# Patient Record
Sex: Female | Born: 1950 | Race: Black or African American | Hispanic: No | Marital: Single | Smoking: Never smoker
Health system: Southern US, Community
[De-identification: ages and names within clinical notes are randomized; demographics above are authoritative.]

## PROBLEM LIST (undated history)

## (undated) DIAGNOSIS — I10 Essential (primary) hypertension: Secondary | ICD-10-CM

## (undated) DIAGNOSIS — E7800 Pure hypercholesterolemia, unspecified: Secondary | ICD-10-CM

---

## 2001-08-07 ENCOUNTER — Emergency Department (HOSPITAL_COMMUNITY): Admission: EM | Admit: 2001-08-07 | Discharge: 2001-08-08 | Payer: Self-pay | Admitting: Emergency Medicine

## 2001-08-07 ENCOUNTER — Encounter: Payer: Self-pay | Admitting: Emergency Medicine

## 2002-03-05 ENCOUNTER — Other Ambulatory Visit: Admission: RE | Admit: 2002-03-05 | Discharge: 2002-03-05 | Payer: Self-pay | Admitting: *Deleted

## 2003-02-05 ENCOUNTER — Encounter: Payer: Self-pay | Admitting: Emergency Medicine

## 2003-02-05 ENCOUNTER — Emergency Department (HOSPITAL_COMMUNITY): Admission: EM | Admit: 2003-02-05 | Discharge: 2003-02-05 | Payer: Self-pay | Admitting: Emergency Medicine

## 2003-09-23 ENCOUNTER — Other Ambulatory Visit: Admission: RE | Admit: 2003-09-23 | Discharge: 2003-09-23 | Payer: Self-pay | Admitting: Obstetrics and Gynecology

## 2004-08-05 ENCOUNTER — Emergency Department (HOSPITAL_COMMUNITY): Admission: EM | Admit: 2004-08-05 | Discharge: 2004-08-05 | Payer: Self-pay | Admitting: Emergency Medicine

## 2004-10-27 ENCOUNTER — Other Ambulatory Visit: Admission: RE | Admit: 2004-10-27 | Discharge: 2004-10-27 | Payer: Self-pay | Admitting: Obstetrics and Gynecology

## 2006-01-11 ENCOUNTER — Emergency Department (HOSPITAL_COMMUNITY): Admission: EM | Admit: 2006-01-11 | Discharge: 2006-01-11 | Payer: Self-pay | Admitting: Emergency Medicine

## 2010-07-05 ENCOUNTER — Other Ambulatory Visit: Payer: Self-pay | Admitting: Family Medicine

## 2010-07-05 DIAGNOSIS — Z1231 Encounter for screening mammogram for malignant neoplasm of breast: Secondary | ICD-10-CM

## 2010-07-25 ENCOUNTER — Ambulatory Visit: Payer: Self-pay

## 2011-02-04 ENCOUNTER — Inpatient Hospital Stay (INDEPENDENT_AMBULATORY_CARE_PROVIDER_SITE_OTHER)
Admission: RE | Admit: 2011-02-04 | Discharge: 2011-02-04 | Disposition: A | Discharge status: Home / Self Care | Payer: Self-pay | Source: Ambulatory Visit | Attending: Emergency Medicine | Admitting: Emergency Medicine

## 2011-02-04 DIAGNOSIS — H109 Unspecified conjunctivitis: Secondary | ICD-10-CM

## 2012-01-08 ENCOUNTER — Emergency Department (HOSPITAL_COMMUNITY)
Admission: EM | Admit: 2012-01-08 | Discharge: 2012-01-08 | Disposition: A | Discharge status: Home / Self Care | Payer: PRIVATE HEALTH INSURANCE | Attending: Emergency Medicine | Admitting: Emergency Medicine

## 2012-01-08 ENCOUNTER — Emergency Department (HOSPITAL_COMMUNITY): Payer: PRIVATE HEALTH INSURANCE

## 2012-01-08 ENCOUNTER — Encounter (HOSPITAL_COMMUNITY): Payer: Self-pay

## 2012-01-08 DIAGNOSIS — E7800 Pure hypercholesterolemia, unspecified: Secondary | ICD-10-CM | POA: Insufficient documentation

## 2012-01-08 DIAGNOSIS — M79643 Pain in unspecified hand: Secondary | ICD-10-CM

## 2012-01-08 DIAGNOSIS — M79609 Pain in unspecified limb: Secondary | ICD-10-CM | POA: Insufficient documentation

## 2012-01-08 DIAGNOSIS — I10 Essential (primary) hypertension: Secondary | ICD-10-CM | POA: Insufficient documentation

## 2012-01-08 HISTORY — DX: Pure hypercholesterolemia, unspecified: E78.00

## 2012-01-08 HISTORY — DX: Essential (primary) hypertension: I10

## 2012-01-08 MED ORDER — HYDROCODONE-ACETAMINOPHEN 5-325 MG PO TABS
1.0000 | ORAL_TABLET | Freq: Once | ORAL | Status: AC
  Administered 2012-01-08: 1 via ORAL
  Filled 2012-01-08: qty 1

## 2012-01-08 MED ORDER — IBUPROFEN 600 MG PO TABS: 600.0000 mg | ORAL_TABLET | Freq: Three times a day (TID) | ORAL | Status: AC | PRN

## 2012-01-08 MED ORDER — HYDROCODONE-ACETAMINOPHEN 5-500 MG PO TABS: 1.0000 | ORAL_TABLET | Freq: Four times a day (QID) | ORAL | Status: AC | PRN

## 2012-01-08 MED ORDER — IBUPROFEN 400 MG PO TABS
400.0000 mg | ORAL_TABLET | Freq: Once | ORAL | Status: AC
  Administered 2012-01-08: 400 mg via ORAL
  Filled 2012-01-08: qty 1

## 2012-01-08 NOTE — Progress Notes (Signed)
Orthopedic Tech Progress Note Patient Details:  Renee George Feb 21, 1951 [id number]  Ortho Devices Type of Ortho Device: Velcro wrist splint Ortho Device/Splint Location: right wrist Ortho Device/Splint Interventions: Application   Nikki Dom 01/08/2012, 3:38 PM

## 2012-01-08 NOTE — ED Notes (Signed)
First injured her right hand years ago today has noted swelling to hand and sts may have splinter in hand. No new injury

## 2012-01-08 NOTE — ED Provider Notes (Signed)
History  This chart was scribed for Suzi Roots, MD by Bennett Scrape. This patient was seen in room TR08C/TR08C and the patient's care was started at 2:00PM.  CSN: [id number]  Arrival date & time 01/08/12  1212   None     Chief Complaint  Patient presents with  . Hand Pain     Patient is a 61 y.o. female presenting with hand pain. The history is provided by the patient. No language interpreter was used.  Hand Pain This is a new problem. The current episode started 2 days ago. The problem occurs constantly. The problem has been gradually worsening. Nothing relieves the symptoms.    Renee George is a 61 y.o. female who presents to the Emergency Department complaining of 2 days of gradual onset, gradually worsening, constant right hand pain with associated swelling. The pain is worse with use of the hand and better with rest. She denies taking OTC medications at home to improve symptoms. She reports one prior injury to the same hand 20 years ago but denies an recent injuries or having prior episodes of similar symptoms. She denies fever, nausea, emesis, HA and rash as associated symptoms.She has a h/o HTN. She denies smoking and alcohol use.  No hx gout. Since initial injury 20 yrs ago, no recurrent problems w hand pain or swelling. No recent trauma. No skin changes or redness, no fever or chills.    Past Medical History  Diagnosis Date  . Hypertension   . High cholesterol     No past surgical history on file.  No family history on file.  History  Substance Use Topics  . Smoking status: Never Smoker   . Smokeless tobacco: Not on file  . Alcohol Use: No    No OB history provided.  Review of Systems  Constitutional: Negative for fever and chills.  Gastrointestinal: Negative for nausea and vomiting.  Musculoskeletal: Negative for back pain.       Positive for right hand pain  Skin: Negative for rash.    Allergies  Review of patient's allergies indicates no  known allergies.  Home Medications   Current Outpatient Rx  Name Route Sig Dispense Refill  . AMLODIPINE BESYLATE 10 MG PO TABS Oral Take 10 mg by mouth daily.    . OMEGA-3 FATTY ACIDS 1000 MG PO CAPS Oral Take 2 g by mouth daily.    . IBUPROFEN 200 MG PO TABS Oral Take 600 mg by mouth every 6 (six) hours as needed. For pain    . LOVASTATIN 20 MG PO TABS Oral Take 20 mg by mouth at bedtime.      Triage vitals: BP 154/74  Pulse 80  Temp 98.4 F (36.9 C)  Resp 18  SpO2 99%  Physical Exam  Nursing note and vitals reviewed. Constitutional: She is oriented to person, place, and time. She appears well-developed and well-nourished. No distress.  HENT:  Head: Normocephalic and atraumatic.  Eyes: EOM are normal.  Neck: Neck supple. No tracheal deviation present.  Cardiovascular: Normal rate.   Pulmonary/Chest: Effort normal. No respiratory distress.  Musculoskeletal: Normal range of motion.       Mild tenderness and edema to the right hand over the 4th and 5th metatarsals and mtp region. Good rom digits without pain. No skin changes or erythema. Normal cap refill distally. Skin intact. No epitroch or axill l/a. No lymphangitis.   Neurological: She is alert and oriented to person, place, and time.  Skin: Skin is warm  and dry.  Psychiatric: She has a normal mood and affect. Her behavior is normal.    ED Course  Procedures (including critical care time)  DIAGNOSTIC STUDIES: Oxygen Saturation is 99% on room air, normal by my interpretation.    COORDINATION OF CARE: 2:31PM-Discussed treatment plan with pt at bedside and pt agreed to plan.   Labs Reviewed - No data to display Dg Hand 2 View Right  01/08/2012  *RADIOLOGY REPORT*  Clinical Data: Fifth metacarpal pain  RIGHT HAND - 2 VIEW  Comparison: None.  Findings: Two views of the right hand submitted.  Small calcifications are noted within the soft tissue adjacent to the distal aspect of fifth metacarpal.  This may be due to prior  injury or dystrophic calcifications.  No acute fracture or subluxation.  IMPRESSION: Small calcifications are noted within the soft tissue adjacent to the distal aspect of fifth metacarpal.  This may be due to prior injury or dystrophic calcifications.  No acute fracture or subluxation.  Original Report Authenticated By: Natasha Mead, M.D.       MDM  I personally performed the services described in this documentation, which was scribed in my presence. The recorded information has been reviewed and considered. Suzi Roots, MD   No redness or increased warmth to hand. To breaks in skin or wounds to hand.  Mild sts and marked tenderness over 5th mt, mtp region. Splint. Motrin, vicodin.  Discussed diff dx and xrays w pt. Will refer to hand f/u.    Suzi Roots, MD 01/08/12 1535

## 2012-10-18 ENCOUNTER — Encounter (HOSPITAL_COMMUNITY): Payer: Self-pay | Admitting: Cardiology

## 2012-10-18 ENCOUNTER — Emergency Department (HOSPITAL_COMMUNITY): Payer: 59

## 2012-10-18 ENCOUNTER — Emergency Department (HOSPITAL_COMMUNITY)
Admission: EM | Admit: 2012-10-18 | Discharge: 2012-10-18 | Disposition: A | Discharge status: Home / Self Care | Payer: 59 | Attending: Emergency Medicine | Admitting: Emergency Medicine

## 2012-10-18 DIAGNOSIS — E7800 Pure hypercholesterolemia, unspecified: Secondary | ICD-10-CM | POA: Insufficient documentation

## 2012-10-18 DIAGNOSIS — Z79899 Other long term (current) drug therapy: Secondary | ICD-10-CM | POA: Insufficient documentation

## 2012-10-18 DIAGNOSIS — R51 Headache: Secondary | ICD-10-CM | POA: Insufficient documentation

## 2012-10-18 DIAGNOSIS — I10 Essential (primary) hypertension: Secondary | ICD-10-CM | POA: Insufficient documentation

## 2012-10-18 DIAGNOSIS — Z91040 Latex allergy status: Secondary | ICD-10-CM | POA: Insufficient documentation

## 2012-10-18 DIAGNOSIS — J189 Pneumonia, unspecified organism: Secondary | ICD-10-CM

## 2012-10-18 DIAGNOSIS — M6281 Muscle weakness (generalized): Secondary | ICD-10-CM | POA: Insufficient documentation

## 2012-10-18 DIAGNOSIS — R42 Dizziness and giddiness: Secondary | ICD-10-CM | POA: Insufficient documentation

## 2012-10-18 LAB — COMPREHENSIVE METABOLIC PANEL
ALT: 14 U/L (ref 0–35)
Alkaline Phosphatase: 72 U/L (ref 39–117)
CO2: 23 mEq/L (ref 19–32)
Chloride: 106 mEq/L (ref 96–112)
GFR calc Af Amer: 83 mL/min — ABNORMAL LOW (ref >90)
GFR calc non Af Amer: 72 mL/min — ABNORMAL LOW (ref >90)
Glucose, Bld: 138 mg/dL — ABNORMAL HIGH (ref 70–99)
Potassium: 3.5 mEq/L (ref 3.5–5.1)
Sodium: 141 mEq/L (ref 135–145)
Total Bilirubin: 0.2 mg/dL — ABNORMAL LOW (ref 0.3–1.2)

## 2012-10-18 LAB — CBC WITH DIFFERENTIAL/PLATELET
Hemoglobin: 11.1 g/dL — ABNORMAL LOW (ref 12.0–15.0)
Lymphocytes Relative: 52 % — ABNORMAL HIGH (ref 12–46)
Lymphs Abs: 2.8 10*3/uL (ref 0.7–4.0)
MCV: 72.7 fL — ABNORMAL LOW (ref 78.0–100.0)
Neutrophils Relative %: 38 % — ABNORMAL LOW (ref 43–77)
Platelets: 132 10*3/uL — ABNORMAL LOW (ref 150–400)
RBC: 4.62 MIL/uL (ref 3.87–5.11)
WBC: 5.3 10*3/uL (ref 4.0–10.5)

## 2012-10-18 LAB — MAGNESIUM: Magnesium: 2 mg/dL (ref 1.5–2.5)

## 2012-10-18 LAB — URINALYSIS, ROUTINE W REFLEX MICROSCOPIC
Bilirubin Urine: NEGATIVE
Hgb urine dipstick: NEGATIVE
Protein, ur: NEGATIVE mg/dL
Specific Gravity, Urine: 1.013 (ref 1.005–1.030)
Urobilinogen, UA: 0.2 mg/dL (ref 0.0–1.0)

## 2012-10-18 MED ORDER — LEVOFLOXACIN 750 MG PO TABS: 750.0000 mg | ORAL_TABLET | Freq: Every day | ORAL | Status: AC

## 2012-10-18 MED ORDER — LEVOFLOXACIN 750 MG PO TABS
750.0000 mg | ORAL_TABLET | Freq: Once | ORAL | Status: AC
  Administered 2012-10-18: 750 mg via ORAL
  Filled 2012-10-18: qty 1

## 2012-10-18 MED ORDER — SODIUM CHLORIDE 0.9 % IV BOLUS (SEPSIS)
1000.0000 mL | Freq: Once | INTRAVENOUS | Status: AC
  Administered 2012-10-18: 1000 mL via INTRAVENOUS

## 2012-10-18 NOTE — ED Notes (Signed)
Waiting on phlebotomy to draw blood before discharging pt.

## 2012-10-18 NOTE — ED Notes (Signed)
Pt transported to and from CT scanner on stretcher with tech, tolerated well. 

## 2012-10-18 NOTE — ED Notes (Signed)
Pt to department via EMS from work- reports last night she started feeling weak at home and had a headache. Reports this morning the headache got worse and general weakness. BP-188/110, then 162/110. Hr-70 20g in place. 12 lead unremarkable.

## 2012-10-18 NOTE — ED Provider Notes (Signed)
History     CSN: [id number]  Arrival date & time 10/18/12  0827   First MD Initiated Contact with Patient 10/18/12 [phone]      Chief Complaint  Patient presents with  . Weakness    (Consider location/radiation/quality/duration/timing/severity/associated sxs/prior treatment) HPI  Patient presents with concerns generalized weakness. Symptoms began last night, with headache, generalized weakness. Since onset headache is become worse, socially.  Initially there was progression of generalized weakness, but this has improved in her lower extremities, she currently complains of weakness in her upper extremities No asymmetry, no seizure, no spasm. No chest pain, no vomiting, no dyspnea. No fever, no cough, no chills. No dysuria. The patient states that she took 2 doses of her Norvasc yesterday, erroneously.    Past Medical History  Diagnosis Date  . Hypertension   . High cholesterol     History reviewed. No pertinent past surgical history.  History reviewed. No pertinent family history.  History  Substance Use Topics  . Smoking status: Never Smoker   . Smokeless tobacco: Not on file  . Alcohol Use: No    OB History   Grav Para Term Preterm Abortions TAB SAB Ect Mult Living                  Review of Systems  Constitutional:       Per HPI, otherwise negative  HENT:       Per HPI, otherwise negative  Respiratory:       Per HPI, otherwise negative  Cardiovascular:       Per HPI, otherwise negative  Gastrointestinal: Negative for vomiting.  Endocrine:       Negative aside from HPI  Genitourinary:       Neg aside from HPI   Musculoskeletal:       Per HPI, otherwise negative  Skin: Negative.   Neurological: Positive for dizziness, weakness and headaches. Negative for tremors, seizures, syncope, facial asymmetry, speech difficulty, light-headedness and numbness.    Allergies  Latex  Home Medications   Current Outpatient Rx  Name  Route  Sig  Dispense   Refill  . amLODipine (NORVASC) 10 MG tablet   Oral   Take 10 mg by mouth daily.         Marland Kitchen ibuprofen (ADVIL,MOTRIN) 200 MG tablet   Oral   Take 200 mg by mouth every 8 (eight) hours as needed for pain. For pain         . lovastatin (MEVACOR) 20 MG tablet   Oral   Take 20 mg by mouth at bedtime.           BP 178/85  Pulse 73  Temp(Src) 98 F (36.7 C) (Oral)  Resp 12  SpO2 97%  Physical Exam  Nursing note and vitals reviewed. Constitutional: She is oriented to person, place, and time. She appears well-developed and well-nourished. No distress.  HENT:  Head: Normocephalic and atraumatic.  Eyes: Conjunctivae and EOM are normal.  Cardiovascular: Normal rate and regular rhythm.   Pulmonary/Chest: Effort normal and breath sounds normal. No stridor. No respiratory distress.  Abdominal: She exhibits no distension.  Musculoskeletal: She exhibits no edema.  Neurological: She is alert and oriented to person, place, and time. No cranial nerve deficit. She exhibits abnormal muscle tone. Coordination normal.  In the lower extremities, strength is 5/5 in hips, knees, ankles. In the upper extremities, strength is 4/5, though improves with encouragement in the shoulders, wrists, elbows. This is symmetric.  Sensation is appropriate  throughout.  Skin: Skin is warm and dry.  Psychiatric: She has a normal mood and affect.    ED Course  Procedures (including critical care time)  Labs Reviewed  CBC WITH DIFFERENTIAL  COMPREHENSIVE METABOLIC PANEL  TROPONIN I  URINALYSIS, ROUTINE W REFLEX MICROSCOPIC  CALCIUM, IONIZED  MAGNESIUM   No results found.   No diagnosis found.  Pulse ox 99% room air normal  11:38 AM Patient ambulatory, though she continues to c/o weakness.  With abnormal CXR, CT ordered.   Date: 10/18/2012  Rate: 69  Rhythm: normal sinus rhythm  QRS Axis: normal  Intervals: normal  ST/T Wave abnormalities: normal  Conduction Disutrbances:none  Narrative  Interpretation:   Old EKG Reviewed: none available Unremarkable    CT results demonstrate a likely pneumonitis, with some concern for autoimmune versus infectious pathology. I discussed the CT with our pulmonology team.  We agreed to initiate antibiotics, and absent fever, chills, leukocytosis, the patient had initiation of autoimmune workup.  I then made a followup appointment for the patient in 4 days at pulmonology clinic. MDM  Patient presents with generalized weakness, no focal abnormalities beyond slightly diminished upper extremity weakness.  Given that she is otherwise neurovascularly intact, has no significant comorbidities, no history of autoimmune disease, has no fever, no chills, no leukocytosis, and largely reassuring laboratory and vital signs, we initiated antibiotics for what seems to be pneumonitis completed by infection.  I arranged close outpatient followup for the patient, she was discharged in stable condition with return precautions, follow instructions.        Gerhard Munch, MD 10/18/12 [phone]

## 2012-10-18 NOTE — ED Notes (Signed)
Family at bedside. 

## 2012-10-19 LAB — RHEUMATOID FACTOR: Rhuematoid fact SerPl-aCnc: <10 IU/mL (ref <=14)

## 2012-10-22 ENCOUNTER — Other Ambulatory Visit (INDEPENDENT_AMBULATORY_CARE_PROVIDER_SITE_OTHER): Payer: PRIVATE HEALTH INSURANCE

## 2012-10-22 ENCOUNTER — Ambulatory Visit (INDEPENDENT_AMBULATORY_CARE_PROVIDER_SITE_OTHER): Payer: PRIVATE HEALTH INSURANCE | Admitting: Internal Medicine

## 2012-10-22 ENCOUNTER — Encounter: Payer: Self-pay | Admitting: Internal Medicine

## 2012-10-22 ENCOUNTER — Encounter: Payer: Self-pay | Admitting: Allergy

## 2012-10-22 VITALS — BP 120/72 | HR 96 | Ht 61.0 in | Wt 169.6 lb

## 2012-10-22 DIAGNOSIS — J8410 Pulmonary fibrosis, unspecified: Secondary | ICD-10-CM

## 2012-10-22 DIAGNOSIS — M329 Systemic lupus erythematosus, unspecified: Secondary | ICD-10-CM

## 2012-10-22 DIAGNOSIS — R05 Cough: Secondary | ICD-10-CM

## 2012-10-22 DIAGNOSIS — R0602 Shortness of breath: Secondary | ICD-10-CM

## 2012-10-22 DIAGNOSIS — R768 Other specified abnormal immunological findings in serum: Secondary | ICD-10-CM | POA: Insufficient documentation

## 2012-10-22 DIAGNOSIS — R053 Chronic cough: Secondary | ICD-10-CM | POA: Insufficient documentation

## 2012-10-22 DIAGNOSIS — J849 Interstitial pulmonary disease, unspecified: Secondary | ICD-10-CM | POA: Insufficient documentation

## 2012-10-22 LAB — CK: Total CK: 183 U/L — ABNORMAL HIGH (ref 7–177)

## 2012-10-22 LAB — ANTI-SCLERODERMA ANTIBODY: Scleroderma (Scl-70) (ENA) Antibody, IgG: 1 AU/mL (ref <30)

## 2012-10-22 NOTE — Assessment & Plan Note (Signed)
Chronic cough could be due to pulmonary interstitial findings if these are chronic. Alternatively it could be due to simple reasons like sinus drainage and acid reflux. I've recommended more aggressive acid reflux control and starting sinus control with the nasal steroids and nasal saline spray. We'll reassess at followup with  cough score

## 2012-10-22 NOTE — Addendum Note (Signed)
Addended byKalman Shan on: 10/22/2012 03:17 PM   Modules accepted: Orders

## 2012-10-22 NOTE — Assessment & Plan Note (Signed)
She has findings consistent with viral pneumonitis but could represent autoimmune disease. She'll need a followup CT scan of the chest in several months to make sure it is resolving

## 2012-10-22 NOTE — Assessment & Plan Note (Signed)
She's lupus antibody positive. Not know if this is true positive. Does not look like medication related. Pulmonary infiltrates could be viral or could be lupus related.  Plan We'll refer her to rheumatology We'll get extensive autoimmune panel

## 2012-10-22 NOTE — Patient Instructions (Addendum)
#  Autoimmune antibiody You might have Lupus affecting your lung - not sure Have additional blood work today See rheumatologist asap  #COugh  - could be related to above  + sinus drainage + acid reflu =  take generic fluticasone inhaler 2 squirts each nostril daily - take percent hypertonic nasal saline spray made that company called [employer], 2 squirts each night - change prilosec to zegerid 20mg  once daily empty stomach in morning  #Shortness of breath - Have full pulmonary function test  #Interstitial lung disease - Either due to viral or autoimmune disease - We'll get followup CT scan of the chest in couple months  #Followup 1 month with cough score at followup

## 2012-10-22 NOTE — Progress Notes (Signed)
Subjective:    Patient ID: Renee George, female    DOB: Feb 25, 1951, 62 y.o.   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 At baseline she has had hypertension and is on antihypertensive meds. with reported good control. She suspects her in 10/15/2012 she mistakenly took anti-lipid medications instead of r pressure medications. Then on 10/18/2012 felt dizzy and weak and reported to the emergency department. According to her history blood pressure was extremely high 200s systolic. She had autoimmune workup with the ESR of 35 and a normal CRP but a positive ANA at 1-160 ratio in a centromere pattern but a normal rheumatoid factor. Urine analysis was normal. CT chest may 20 13,014 showed some nonspecific infiltrates consistent with possible viral pneumonitis pattern. There for she's been referred here. Since being seen in the emergency room in the last 4 days she is new onset shortness of breath for exertional 30 feet or 40 feet relieved with rest. Intensity is moderate. There is no other aggravating factor. This was associated chest pain or dizziness or no further collapse. Walking test 185 feet x3 laps he did not desaturate today in the office.   Of note, she has a history of chronic cough x1 year. Insidious onset. Quality is dry. Present day and night. Severity is moderate. There is associated ticklish sensation in her throat. She admits to associated postnasal drip and acid reflux disease. RSOI cough score is 18 and c/w LPR cough   Dyspnea relevant hx  Obese - Body mass index is 32.06 kg/(m^2). Walk test 185 feet x 3 laps: did not desaturate Denies medical history of COPD, asthma. She's not a smoker  reports that she has never smoked. She does not have any smokeless tobacco history on file.    Cough relevant hx  #1 sinus and allergies - She is chronic postnasal drip and is associated  ticklish sensation in her throat. She's not on any treatment for this. She feels one of her nostrils as always blocked.  #2 acid reflux disease - She is chronic acid reflux disease. She's taking Prilosec with some moderate control.  #3 pulmonary history -She's not a smoker. She denies any history of COPD or asthma. -CT scan of the chest Oct 18, 2012  CT chest Oct 18, 2012:  IMPRESSION:  1. Focal cluster of tree in bud micronodularity in the inferior  right upper lobe with a few additional nodular opacities in the  right upper lung and superior segment of the right lower lobe most  consistent with an age indeterminate infectious/inflammatory or  granulomatous process. The largest single nodule measures up to 5  mm. Recommend repeat imaging in 4 - 6 weeks following an  appropriate course of therapy to assess for stability.  2. Trace bilateral pleural effusions.  3. Mild atherosclerotic vascular disease.  Original Report Authenticated By: Malachy Moan, M.D.    #BP  - on ARB. Not on ace inhibitor  Dr Gretta Cool Reflux Symptom Index (> 13-15 suggestive of LPR cough) 0 -> 5  =  none ->severe problem 10/22/2012   Hoarseness of problem with voice 0  Clearing  Of Throat 1  Excess throat mucus or feeling of post nasal drip 3  Difficulty swallowing food, liquid or tablets 0  Cough after eating or lying down 0  Breathing difficulties or choking episodes 4  Troublesome or annoying cough 4  Sensation of something sticking  in throat or lump in throat 2  Heartburn, chest pain, indigestion, or stomach acid coming up 4  TOTAL 18     Kouffman Reflux v Neur3ogenic Cough Differentiator Reflux 10/22/2012   Do you awaken from a sound sleep coughing violently?                            With trouble breathing? Yes  Do you have choking episodes when you cannot  Get enough air, gasping for air ?              Yes  Do you usually cough when you lie down into  The bed, or when you just lie down  to rest ?                          Yes  Do you usually cough after meals or eating?         no  Do you cough when (or after) you bend over?    no  GERD SCORE  3  Kouffman Reflux v Neurogenic Cough Differentiator Neurogenic  Do you more-or-less cough all day long? n  Does change of temperature make you cough? n  Does laughing or chuckling cause you to cough? y  Do fumes (perfume, automobile fumes, burned  Toast, etc.,) cause you to cough ?      n  Does speaking, singing, or talking on the phone cause you to cough   ?               n  Neurogenic/Airway score 1   Past Medical History  Diagnosis Date  . Hypertension   . High cholesterol      No family history on file.   History   Social History  . Marital Status: Single    Spouse Name: N/A    Number of Children: N/A  . Years of Education: N/A   Occupational History  . Not on file.   Social History Main Topics  . Smoking status: Never Smoker   . Smokeless tobacco: Not on file  . Alcohol Use: No  . Drug Use: No  . Sexually Active:    Other Topics Concern  . Not on file   Social History Narrative  . No narrative on file     Allergies  Allergen Reactions  . Lovastatin     NAUSEA VOMITING HEADACHE   . Latex Rash    Gloves -     Outpatient Prescriptions Prior to Visit  Medication Sig Dispense Refill  . amLODipine (NORVASC) 10 MG tablet Take 10 mg by mouth daily.      Marland Kitchen ibuprofen (ADVIL,MOTRIN) 200 MG tablet Take 200 mg by mouth every 8 (eight) hours as needed for pain. For pain      . levofloxacin (LEVAQUIN) 750 MG tablet Take 1 tablet (750 mg total) by mouth daily.  7 tablet  0  . lovastatin (MEVACOR) 20 MG tablet Take 20 mg by mouth at bedtime.       No facility-administered medications prior to visit.      Review of Systems  Constitutional: Negative for fever and unexpected weight change.  HENT: Negative for ear pain, nosebleeds, congestion, sore throat, rhinorrhea, sneezing, trouble swallowing, dental  problem, postnasal drip and sinus pressure.   Eyes: Positive for redness and itching.  Respiratory: Positive for cough and shortness of breath. Negative for chest tightness and wheezing.  Cardiovascular: Negative for palpitations and leg swelling.       HANDS SWELLING.   Gastrointestinal: Negative for nausea and vomiting.  Genitourinary: Negative for dysuria.  Musculoskeletal: Negative for joint swelling.  Skin: Negative for rash.  Neurological: Negative for headaches.  Hematological: Does not bruise/bleed easily.  Psychiatric/Behavioral: Negative for dysphoric mood. The patient is not nervous/anxious.    Filed Vitals:   10/22/12 1412  BP: 120/72  Pulse: 96  Height: 5\' 1"  (1.549 m)  Weight: 169 lb 9.6 oz (76.93 kg)  SpO2: 99%       Objective:   Physical Exam  Vitals reviewed. Constitutional: She is oriented to person, place, and time. She appears well-developed and well-nourished. No distress.  Body mass index is 32.06 kg/(m^2).   HENT:  Head: Normocephalic and atraumatic.  Right Ear: External ear normal.  Left Ear: External ear normal.  Mouth/Throat: Oropharynx is clear and moist. No oropharyngeal exudate.  Swollen turbinates  Eyes: Conjunctivae and EOM are normal. Pupils are equal, round, and reactive to light. Right eye exhibits no discharge. Left eye exhibits no discharge. No scleral icterus.  Neck: Normal range of motion. Neck supple. No JVD present. No tracheal deviation present. No thyromegaly present.  Cardiovascular: Normal rate, regular rhythm, normal heart sounds and intact distal pulses.  Exam reveals no gallop and no friction rub.   No murmur heard. Pulmonary/Chest: Effort normal and breath sounds normal. No respiratory distress. She has no wheezes. She has no rales. She exhibits no tenderness.  Small red macule 3cm in area of chest at angle of lewis  Abdominal: Soft. Bowel sounds are normal. She exhibits no distension and no mass. There is no tenderness. There  is no rebound and no guarding.  Musculoskeletal: Normal range of motion. She exhibits no edema and no tenderness.  Lymphadenopathy:    She has no cervical adenopathy.  Neurological: She is alert and oriented to person, place, and time. She has normal reflexes. No cranial nerve deficit. She exhibits normal muscle tone. Coordination normal.  Skin: Skin is warm and dry. No rash noted. She is not diaphoretic. No erythema. No pallor.  Psychiatric: She has a normal mood and affect. Her behavior is normal. Judgment and thought content normal.          Assessment & Plan:

## 2012-10-22 NOTE — Assessment & Plan Note (Signed)
This is a new symptom after admission to the emergency department. Currently blood pressure is normal. Differential diagnosis includes obesity, deconditioning, diastolic dysfunction especially given her hypertension and obesity. I doubt it is due to the pulmonary and for this because this is too small. Uncommon differential diagnosis includes pulmonary hypertension from lupus but this will depend on confirmation of lupus  Walk test 185 feet x3 laps did not desaturate  Plan  Get full pulmonary function test

## 2012-10-23 LAB — LUPUS ANTICOAGULANT PANEL
DRVVT: 34.8 secs (ref <42.9)
Lupus Anticoagulant: NOT DETECTED

## 2012-10-23 LAB — ANCA SCREEN W REFLEX TITER
Atypical p-ANCA Screen: NEGATIVE
p-ANCA Screen: NEGATIVE

## 2012-10-23 LAB — ANTI-SCLERODERMA ANTIBODY: Scleroderma (Scl-70) (ENA) Antibody, IgG: 1 AU/mL (ref <30)

## 2012-11-01 ENCOUNTER — Telehealth: Payer: Self-pay | Admitting: Internal Medicine

## 2012-11-01 ENCOUNTER — Ambulatory Visit (INDEPENDENT_AMBULATORY_CARE_PROVIDER_SITE_OTHER): Payer: PRIVATE HEALTH INSURANCE | Admitting: Internal Medicine

## 2012-11-01 DIAGNOSIS — J849 Interstitial pulmonary disease, unspecified: Secondary | ICD-10-CM

## 2012-11-01 DIAGNOSIS — R0602 Shortness of breath: Secondary | ICD-10-CM

## 2012-11-01 LAB — PULMONARY FUNCTION TEST
DL/VA % pred: 106 %
FEF 25-75 Post: 1.36 L/sec
FEF2575-%Change-Post: -25 %
FEF2575-%Pred-Pre: 104 %
FEV1-%Change-Post: -5 %
FEV1-%Pred-Post: 94 %
FEV1-%Pred-Pre: 100 %
FEV1-Pre: 1.72 L
FEV1FVC-%Change-Post: 3 %
FEV1FVC-%Pred-Pre: 103 %
FEV6-%Change-Post: -8 %
FEV6-%Pred-Post: 90 %
FEV6-Pre: 2.11 L
FEV6FVC-%Pred-Pre: 104 %
Post FEV1/FVC ratio: 85 %
Post FEV6/FVC ratio: 100 %
Pre FEV1/FVC ratio: 82 %
RV % pred: 82 %
RV: 1.53 L
TLC % pred: 73 %

## 2012-11-01 NOTE — Telephone Encounter (Signed)
Spoke to pt. She is aware that Zegerid is OTC. She verbalized understanding. Nothing further was needed.

## 2012-11-01 NOTE — Progress Notes (Signed)
PFT done today. 

## 2012-11-12 ENCOUNTER — Telehealth: Payer: Self-pay | Admitting: Internal Medicine

## 2012-11-12 NOTE — Telephone Encounter (Signed)
I will be seeing her 11/26/2012.  Pulmonary function test she does 11/01/2012 is normal. FVC is 2.1 L/90%. FEV1 1.7 L/100%. Ratio is 82/103%. Total assistant 13/73%. Residual volume is 1.5 L/82%. Residual volume/TLC is 46/114%. DLCO is 18.1/92%.  Overall normal except TLC appears reduced

## 2012-11-22 ENCOUNTER — Telehealth: Payer: Self-pay | Admitting: Internal Medicine

## 2012-11-22 NOTE — Telephone Encounter (Signed)
Pt is requesting lab results from may. She stated r/s her appt to 12/05/12. She is no longer scheduled for 11/26/12. Please advise MR thanks

## 2012-11-26 ENCOUNTER — Ambulatory Visit: Payer: PRIVATE HEALTH INSURANCE | Admitting: Internal Medicine

## 2012-11-26 NOTE — Telephone Encounter (Signed)
Spoke with patient-aware of results from MR; states she has Rheumatology appointment on July 10th PM; then see MR that same day at 4:15pm; pt aware to keep appointments and discuss all finding with MR that day.  Nothing more needed at this time. Will sign note.

## 2012-11-26 NOTE — Telephone Encounter (Signed)
One of the autoimmun tests ANA which is for SLE was positive but this has to be interpreted in conjunction iwht clinical findings. BY itself sometimes it can be false positive. That is why I wanted her to see rheumatology? Did she see them? IF so, they would have explained the results  Breathing test was normal/near normal  Iwill see at followup to go over thiungs  Dr. Kalman Shan, M.D., [facility].C.P Pulmonary and Critical Care Medicine Staff Physician [facility] Pager: [phone], If no answer or between  15:00h - 7:00h: call [phone]  11/26/2012 11:18 AM

## 2012-12-05 ENCOUNTER — Ambulatory Visit (INDEPENDENT_AMBULATORY_CARE_PROVIDER_SITE_OTHER): Payer: PRIVATE HEALTH INSURANCE | Admitting: Internal Medicine

## 2012-12-05 ENCOUNTER — Encounter: Payer: Self-pay | Admitting: Internal Medicine

## 2012-12-05 VITALS — BP 128/70 | HR 78 | Temp 98.3°F | Ht 61.5 in | Wt 171.8 lb

## 2012-12-05 DIAGNOSIS — R05 Cough: Secondary | ICD-10-CM

## 2012-12-05 DIAGNOSIS — J849 Interstitial pulmonary disease, unspecified: Secondary | ICD-10-CM

## 2012-12-05 DIAGNOSIS — R768 Other specified abnormal immunological findings in serum: Secondary | ICD-10-CM

## 2012-12-05 DIAGNOSIS — J189 Pneumonia, unspecified organism: Secondary | ICD-10-CM

## 2012-12-05 DIAGNOSIS — J387 Other diseases of larynx: Secondary | ICD-10-CM

## 2012-12-05 DIAGNOSIS — J8410 Pulmonary fibrosis, unspecified: Secondary | ICD-10-CM

## 2012-12-05 DIAGNOSIS — R894 Abnormal immunological findings in specimens from other organs, systems and tissues: Secondary | ICD-10-CM

## 2012-12-05 MED ORDER — FLUTICASONE PROPIONATE 50 MCG/ACT NA SUSP: 2.0000 | Freq: Every day | NASAL | Status: AC

## 2012-12-05 NOTE — Patient Instructions (Addendum)
#  Autoimmune antibiody You might have Lupus affecting your lung - not sure Keep rheumatology appt 12/11/12   #COugh  - could be related to above  + sinus drainage + acid refluz - For sinus:  -  take generic fluticasone inhaler 2 squirts each nostril daily  - - take 3% percent hypertonic nasal saline spray made that company called [employer], 2 squirts each night - For acid reflux:   - - continue prilosec or zegerid daily - For irritable throat  - see ENT, made referral  #Interstitial lung disease - Either due to viral or autoimmune disease - get followup CT scan of the chest mid-end august 2014  #Followup Mid-\ August 2014 after CT chest Cough score at folloowup

## 2012-12-05 NOTE — Progress Notes (Signed)
Subjective:    Patient ID: Renee George, female    DOB: 20-Jun-1950, 62 y.o.   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 At baseline she has had hypertension and is on antihypertensive meds. with reported good control. She suspects her in 10/15/2012 she mistakenly took anti-lipid medications instead of r pressure medications. Then on 10/18/2012 felt dizzy and weak and reported to the emergency department. According to her history blood pressure was extremely high 200s systolic. She had autoimmune workup with the ESR of 35 and a normal CRP but a positive ANA at 1-160 ratio in a centromere pattern but a normal rheumatoid factor. Urine analysis was normal. CT chest may 20 13,014 showed some nonspecific infiltrates consistent with possible viral pneumonitis pattern. There for she's been referred here. Since being seen in the emergency room in the last 4 days she is new onset shortness of breath for exertional 30 feet or 40 feet relieved with rest. Intensity is moderate. There is no other aggravating factor. This was associated chest pain or dizziness or no further collapse. Walking test 185 feet x3 laps he did not desaturate today in the office.   Of note, she has a history of chronic cough x1 year. Insidious onset. Quality is dry. Present day and night. Severity is moderate. There is associated ticklish sensation in her throat. She admits to associated postnasal drip and acid reflux disease. RSOI cough score is 18 and c/w LPR cough   Dyspnea relevant hx  Obese - Body mass index is 32.06 kg/(m^2). Walk test 185 feet x 3 laps: did not desaturate Denies medical history of COPD, asthma. She's not a smoker  reports that she has never smoked. She does not have any smokeless tobacco history on file.    Cough relevant hx  #1 sinus and allergies - She is chronic postnasal drip and is associated ticklish sensation  in her throat. She's not on any treatment for this. She feels one of her nostrils as always blocked.  #2 acid reflux disease - She is chronic acid reflux disease. She's taking Prilosec with some moderate control.  #3 pulmonary history -She's not a smoker. She denies any history of COPD or asthma. -CT scan of the chest Oct 18, 2012 -  Pulmonary function test she does 11/01/2012 is normal. FVC is 2.1 L/90%. FEV1 1.7 L/100%. Ratio is 82/103%. Total assistant 13/73%. Residual volume is 1.5 L/82%. Residual volume/TLC is 46/114%. DLCO is 18.1/92%. Overall normal except TLC appears reduc  CT chest Oct 18, 2012:  IMPRESSION:  1. Focal cluster of tree in bud micronodularity in the inferior  right upper lobe with a few additional nodular opacities in the  right upper lung and superior segment of the right lower lobe most  consistent with an age indeterminate infectious/inflammatory or  granulomatous process. The largest single nodule measures up to 5  mm. Recommend repeat imaging in 4 - 6 weeks following an  appropriate course of therapy to assess for stability.  2. Trace bilateral pleural effusions.  3. Mild atherosclerotic vascular disease.  Original Report Authenticated By: Malachy Moan, M.D.    #BP  - on ARB. Not on ace inhibitor  REC #Autoimmune antibiody You might have Lupus affecting your lung - not sure Have additional blood work today; Autoimmune panel neagative exdwpt ESR 35, Total CK 183, ANA positive 1:160 See rheumatologist asap  #COugh  - could be related to above  +  sinus drainage + acid reflu =  take generic fluticasone inhaler 2 squirts each nostril daily - take percent hypertonic nasal saline spray made that company called [employer] med, 2 squirts each night - change prilosec to zegerid 20mg  once daily empty stomach in morning  #Shortness of breath - Have full pulmonary function test  #Interstitial lung disease - Either due to viral or autoimmune disease - We'll  get followup CT scan of the chest in couple months  #Followup 1 month with cough score at followup   OV 12/05/2012  FU  - chronic cough with pulmonary infiltrates and  ANA positivity. Daughter who works at [employer] with her  - Cough still present without change. She did nto fllow much of the instructions and apepars to have poor insight into the various things discussed last OV. She did not start sinus drainage inhalers as recommended fearing cost. She was surprised to learn these could cost < $ 10. She is clearing throat a lot in the office. RSI cough score is 32 and reflects irriable larynx syndrome/LPR cough. She is only taking PPI. No sputum. She is saying that there is something stuck high up in her throat. HEr fu CT will be end August 2014  - ANA positivity: due to see Dr Antony Odea on 12/11/12.  I had to explain all over again why she has appt with rheum and our thought process  Dr Gretta Cool Reflux Symptom Index (> 13-15 suggestive of LPR cough)  10/22/2012  12/05/2012   Hoarseness of problem with voice 0 4  Clearing  Of Throat 1 4  Excess throat mucus or feeling of post nasal drip 3 3  Difficulty swallowing food, liquid or tablets 0 0  Cough after eating or lying down 0 4  Breathing difficulties or choking episodes 4 4  Troublesome or annoying cough 4 4  Sensation of something sticking in throat or lump in throat 2 4  Heartburn, chest pain, indigestion, or stomach acid coming up 4 5  TOTAL 18 32     Kouffman Reflux v Neur3ogenic Cough Differentiator Reflux 10/22/2012  12/05/2012   Do you awaken from a sound sleep coughing violently?                            With trouble breathing? Yes yes  Do you have choking episodes when you cannot  Get enough air, gasping for air ?              Yes yes  Do you usually cough when you lie down into  The bed, or when you just lie down to rest ?                          Yes yes  Do you usually cough after meals or eating?          no no  Do you cough when (or after) you bend over?    no no  GERD SCORE  3 3  Kouffman Reflux v Neurogenic Cough Differentiator Neurogenic   Do you more-or-less cough all day long? n no  Does change of temperature make you cough? n no  Does laughing or chuckling cause you to cough? y yes  Do fumes (perfume, automobile fumes, burned  Toast, etc.,) cause you to cough ?      n no  Does speaking, singing, or talking on the phone cause you to  cough   ?               n no  Neurogenic/Airway score 1 1     Review of Systems  Constitutional: Negative for fever and unexpected weight change.  HENT: Positive for sore throat ( scratchy throat). Negative for ear pain, nosebleeds, congestion, rhinorrhea, sneezing, trouble swallowing, dental problem, postnasal drip and sinus pressure.   Eyes: Negative for redness and itching.  Respiratory: Positive for cough. Negative for chest tightness, shortness of breath and wheezing.   Cardiovascular: Negative for palpitations and leg swelling.  Gastrointestinal: Negative for nausea and vomiting.  Genitourinary: Negative for dysuria.  Musculoskeletal: Negative for joint swelling.  Skin: Negative for rash.  Neurological: Negative for headaches.  Hematological: Does not bruise/bleed easily.  Psychiatric/Behavioral: Negative for dysphoric mood. The patient is not nervous/anxious.        Objective:   Physical Exam  Vitals reviewed. Constitutional: She is oriented to person, place, and time. She appears well-developed and well-nourished. No distress.  Obese Clears throat Post nasal drip +  HENT:  Head: Normocephalic and atraumatic.  Right Ear: External ear normal.  Left Ear: External ear normal.  Mouth/Throat: Oropharynx is clear and moist. No oropharyngeal exudate.  Eyes: Conjunctivae and EOM are normal. Pupils are equal, round, and reactive to light. Right eye exhibits no discharge. Left eye exhibits no discharge. No scleral icterus.  Neck: Normal range  of motion. Neck supple. No JVD present. No tracheal deviation present. No thyromegaly present.  Cardiovascular: Normal rate, regular rhythm, normal heart sounds and intact distal pulses.  Exam reveals no gallop and no friction rub.   No murmur heard. Pulmonary/Chest: Effort normal and breath sounds normal. No respiratory distress. She has no wheezes. She has no rales. She exhibits no tenderness.  Abdominal: Soft. Bowel sounds are normal. She exhibits no distension and no mass. There is no tenderness. There is no rebound and no guarding.  Musculoskeletal: Normal range of motion. She exhibits no edema and no tenderness.  Lymphadenopathy:    She has no cervical adenopathy.  Neurological: She is alert and oriented to person, place, and time. She has normal reflexes. No cranial nerve deficit. She exhibits normal muscle tone. Coordination normal.  Skin: Skin is warm and dry. No rash noted. She is not diaphoretic. No erythema. No pallor.  Psychiatric: She has a normal mood and affect. Her behavior is normal. Judgment and thought content normal.          Assessment & Plan:

## 2012-12-09 NOTE — Assessment & Plan Note (Signed)
  #  Interstitial lung disease - Either due to viral or autoimmune disease - get followup CT scan of the chest mid-end august 2014  #Followup Mid-\ August 2014 after CT chest Cough score at folloowup

## 2012-12-09 NOTE — Assessment & Plan Note (Signed)
#  COugh  - could be related to above  + sinus drainage + acid refluz - For sinus:  -  take generic fluticasone inhaler 2 squirts each nostril daily  - - take 3% percent hypertonic nasal saline spray made that company called [employer] med, 2 squirts each night - For acid reflux:   - - continue prilosec or zegerid daily - For irritable throat  - see ENT, made referral

## 2012-12-09 NOTE — Assessment & Plan Note (Signed)
#  Autoimmune antibiody You might have Lupus affecting your lung - not sure Keep rheumatology appt 12/11/12

## 2012-12-13 ENCOUNTER — Telehealth: Payer: Self-pay | Admitting: Internal Medicine

## 2012-12-13 NOTE — Telephone Encounter (Signed)
uanble to close note due to "vaccine administration pending"  Dr. Kalman Shan, M.D., [facility].C.P Pulmonary and Critical Care Medicine Staff Physician [facility] [place] [facility] Pager: [phone], If no answer or between  15:00h - 7:00h: call [phone]  12/13/2012 1:36 AM

## 2012-12-18 ENCOUNTER — Telehealth: Payer: Self-pay | Admitting: Internal Medicine

## 2012-12-18 NOTE — Telephone Encounter (Signed)
Call dr Corliss Skains office and get entire note; only cover letter came  Dr. Kalman Shan, M.D., [facility].C.P Pulmonary and Critical Care Medicine Staff Physician [facility] [place] [facility] and Critical Care Pager: [phone], If no answer or between  15:00h - 7:00h: call [phone]  12/18/2012 9:46 PM

## 2012-12-19 NOTE — Telephone Encounter (Signed)
Per MR he received all records. Nothing further needed.Carron Curie, CMA

## 2012-12-19 NOTE — Telephone Encounter (Signed)
Ok you should be able to close now. Carron Curie, CMA

## 2013-01-13 ENCOUNTER — Other Ambulatory Visit: Payer: PRIVATE HEALTH INSURANCE

## 2013-11-24 IMAGING — CT CT HEAD W/O CM
2 series · 16 of 30 positions shown, 20 images · non-contrast
Comparison: None.

CLINICAL DATA: Headache, weakness, photophobia

CT HEAD WITHOUT CONTRAST
TECHNIQUE: Contiguous axial images were obtained from the base of
the skull through the vertex without contrast.

[Series 5: head w/o · axial · non-contrast · 0.42mm/px · z∈[+72,+192]mm · 13 of 28 slices shown, 17 images]
[im 2/28  brain]
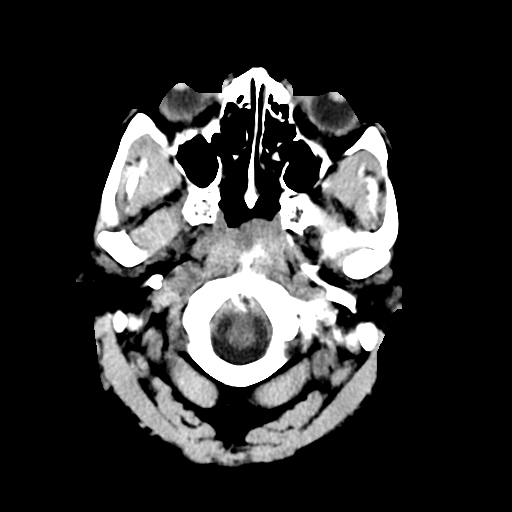
[im 2/28  bone]
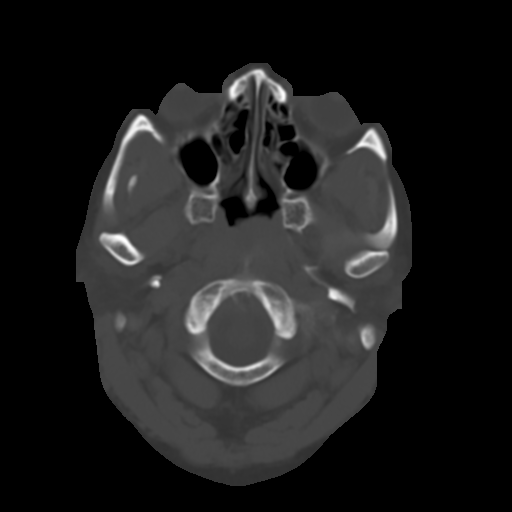
[im 4/28  brain]
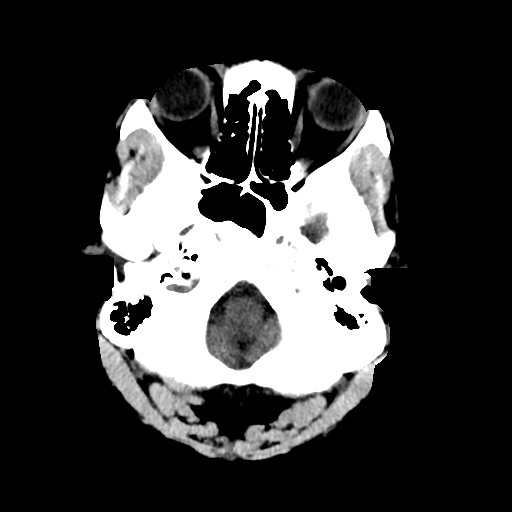
[im 6/28  brain]
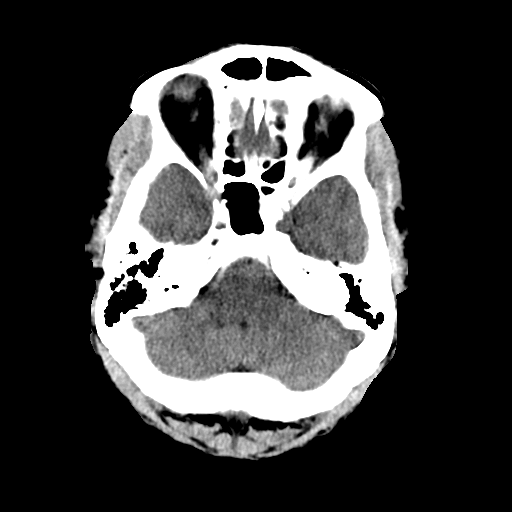
[im 8/28  brain]
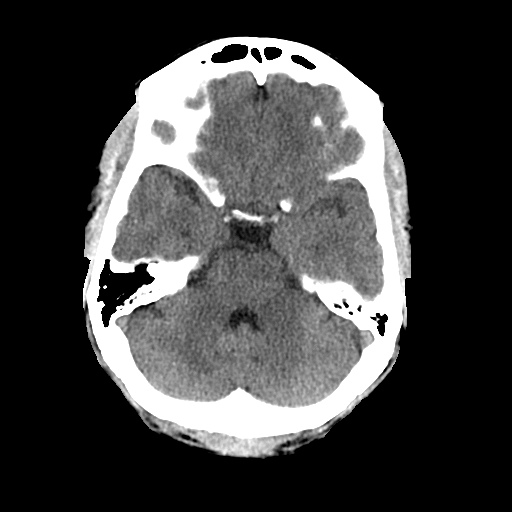
[im 10/28  brain]
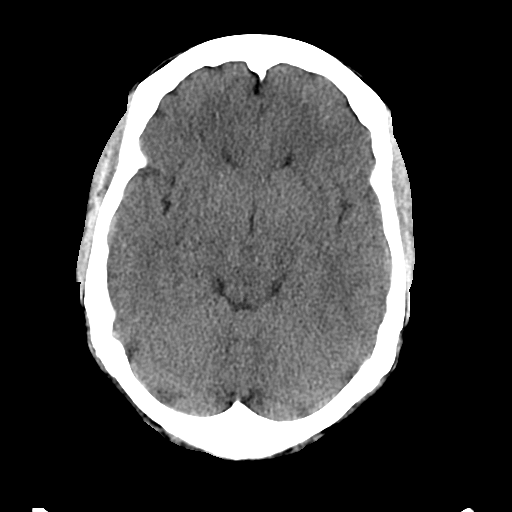
[im 10/28  bone]
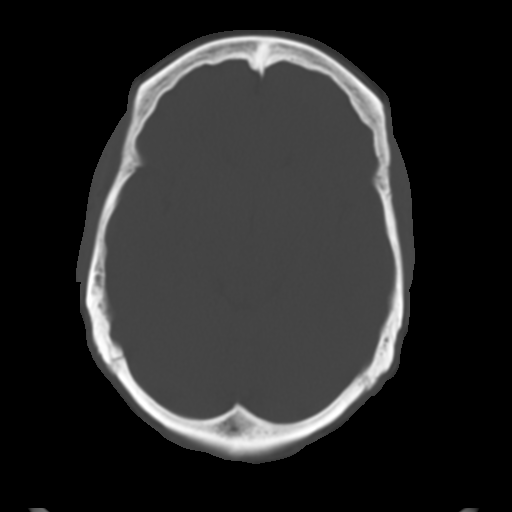
[im 12/28  brain]
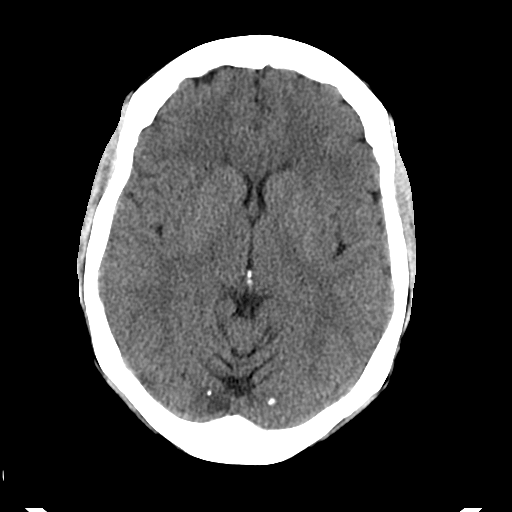
[im 14/28  brain]
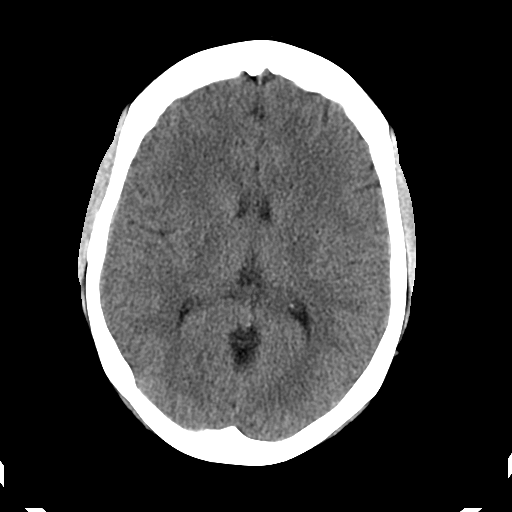
[im 16/28  brain]
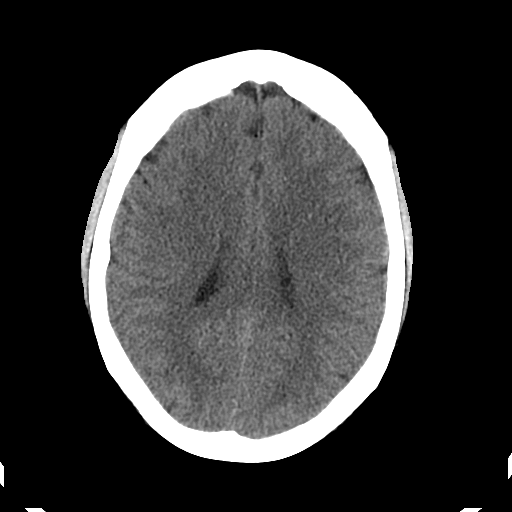
[im 18/28  brain]
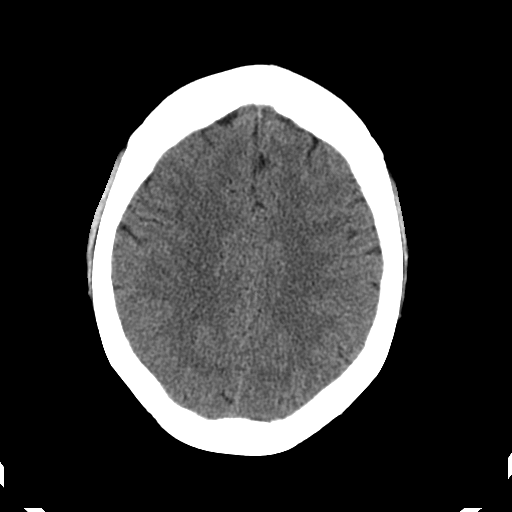
[im 18/28  bone]
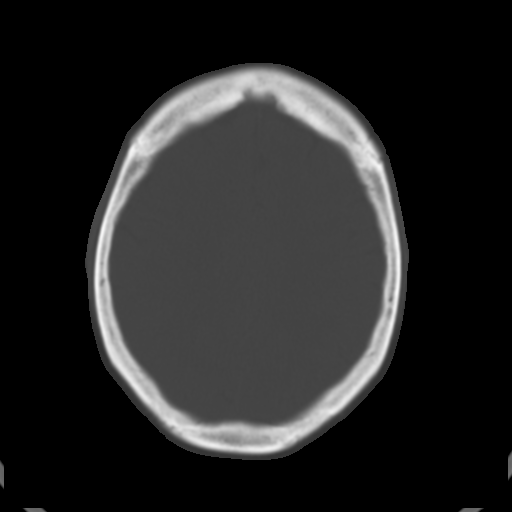
[im 20/28  brain]
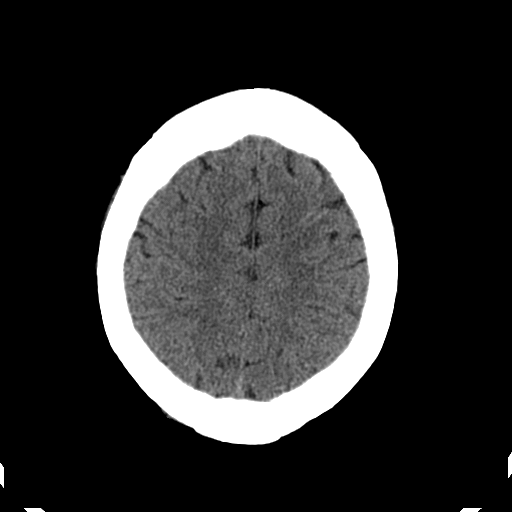
[im 22/28  brain]
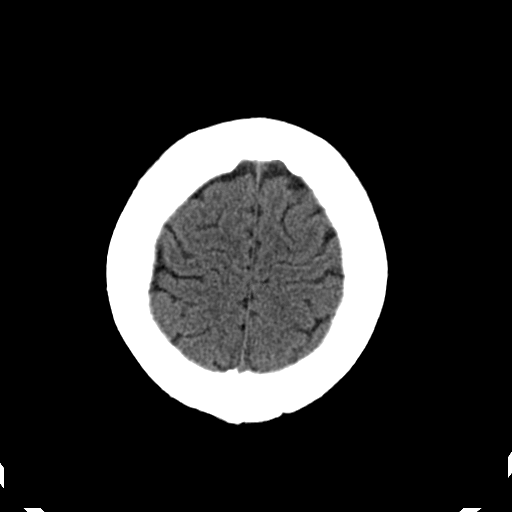
[im 24/28  brain]
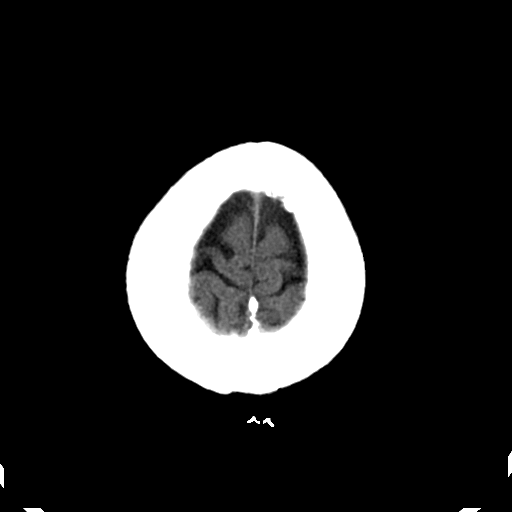
[im 26/28  brain]
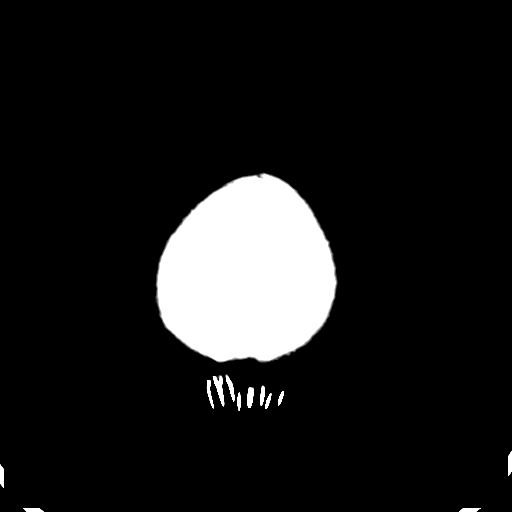
[im 26/28  bone]
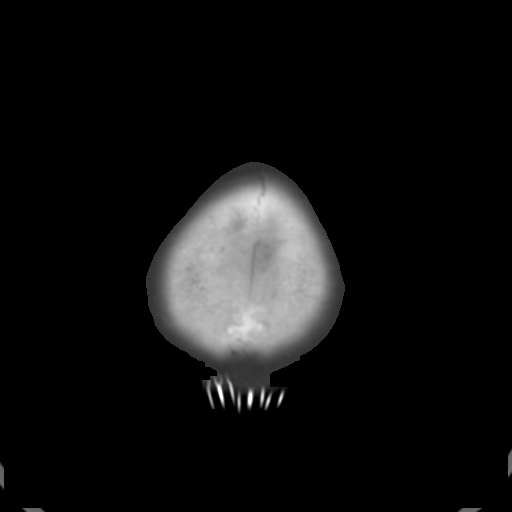

[Series 6: head w/o bone · axial · non-contrast · 0.42mm/px · z∈[+72,+112]mm · 3 of 28 slices shown]
[im 2/28  bone]
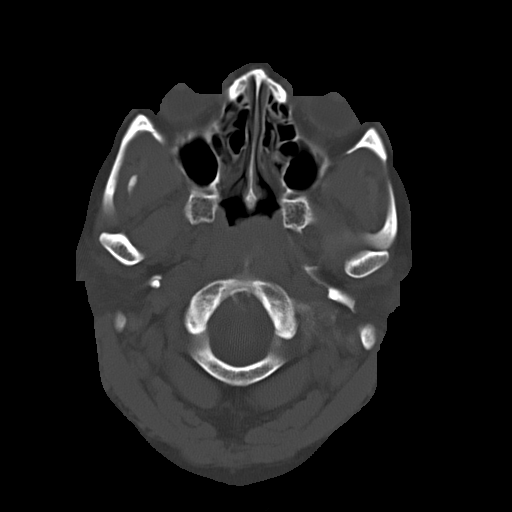
[im 6/28  bone]
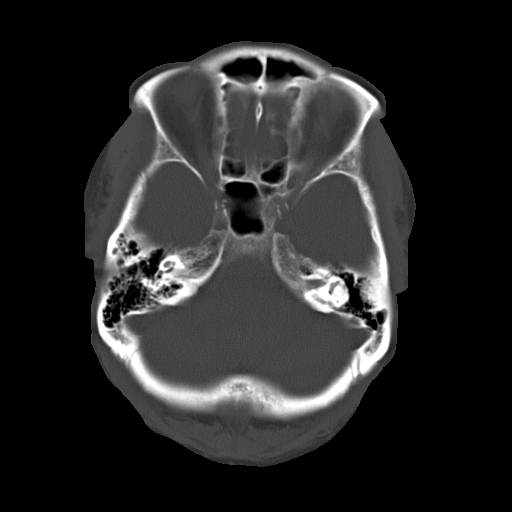
[im 10/28  bone]
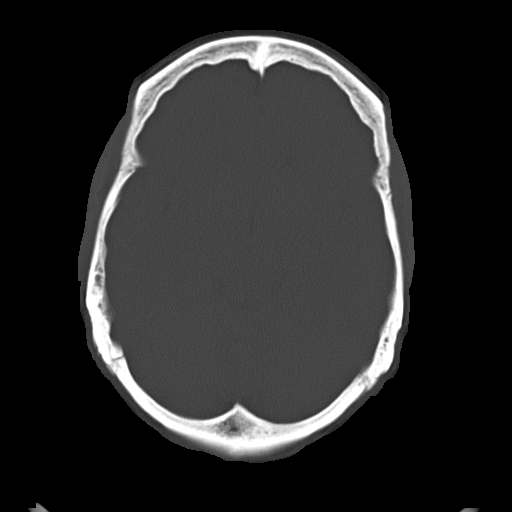

[16 of 30 positions shown; findings below may reference images not displayed]

FINDINGS: No evidence of parenchymal hemorrhage or extra-axial
fluid collection. No mass lesion, mass effect, or midline shift.

No CT evidence of acute infarction.

Cerebral volume is age appropriate.  No ventriculomegaly.

The visualized paranasal sinuses are essentially clear. The mastoid
air cells are unopacified.

No evidence of calvarial fracture.
IMPRESSION: No evidence of acute intracranial abnormality.

## 2018-06-04 DIAGNOSIS — R5383 Other fatigue: Secondary | ICD-10-CM | POA: Diagnosis not present

## 2018-06-04 DIAGNOSIS — E785 Hyperlipidemia, unspecified: Secondary | ICD-10-CM | POA: Diagnosis not present

## 2018-06-04 DIAGNOSIS — D179 Benign lipomatous neoplasm, unspecified: Secondary | ICD-10-CM | POA: Diagnosis not present

## 2018-06-04 DIAGNOSIS — I10 Essential (primary) hypertension: Secondary | ICD-10-CM | POA: Diagnosis not present

## 2018-06-04 DIAGNOSIS — I700 Atherosclerosis of aorta: Secondary | ICD-10-CM | POA: Diagnosis not present

## 2018-06-04 DIAGNOSIS — R3989 Other symptoms and signs involving the genitourinary system: Secondary | ICD-10-CM | POA: Diagnosis not present

## 2018-06-04 DIAGNOSIS — R109 Unspecified abdominal pain: Secondary | ICD-10-CM | POA: Diagnosis not present

## 2018-06-04 DIAGNOSIS — E119 Type 2 diabetes mellitus without complications: Secondary | ICD-10-CM | POA: Diagnosis not present

## 2018-06-04 DIAGNOSIS — D1723 Benign lipomatous neoplasm of skin and subcutaneous tissue of right leg: Secondary | ICD-10-CM | POA: Diagnosis not present

## 2018-06-20 DIAGNOSIS — K760 Fatty (change of) liver, not elsewhere classified: Secondary | ICD-10-CM | POA: Diagnosis not present

## 2018-06-20 DIAGNOSIS — R945 Abnormal results of liver function studies: Secondary | ICD-10-CM | POA: Diagnosis not present

## 2018-06-25 DIAGNOSIS — E119 Type 2 diabetes mellitus without complications: Secondary | ICD-10-CM | POA: Diagnosis not present

## 2018-06-25 DIAGNOSIS — I10 Essential (primary) hypertension: Secondary | ICD-10-CM | POA: Diagnosis not present

## 2018-06-25 DIAGNOSIS — R7989 Other specified abnormal findings of blood chemistry: Secondary | ICD-10-CM | POA: Diagnosis not present

## 2018-06-25 DIAGNOSIS — M1990 Unspecified osteoarthritis, unspecified site: Secondary | ICD-10-CM | POA: Diagnosis not present

## 2018-07-16 DIAGNOSIS — R1013 Epigastric pain: Secondary | ICD-10-CM | POA: Diagnosis not present

## 2018-07-16 DIAGNOSIS — R945 Abnormal results of liver function studies: Secondary | ICD-10-CM | POA: Diagnosis not present

## 2019-01-03 ENCOUNTER — Emergency Department (HOSPITAL_COMMUNITY)
Admission: EM | Admit: 2019-01-03 | Discharge: 2019-01-03 | Disposition: A | Discharge status: Home / Self Care | Payer: Medicare Other | Attending: Emergency Medicine | Admitting: Emergency Medicine

## 2019-01-03 ENCOUNTER — Encounter (HOSPITAL_COMMUNITY): Payer: Self-pay | Admitting: Emergency Medicine

## 2019-01-03 ENCOUNTER — Other Ambulatory Visit: Payer: Self-pay

## 2019-01-03 DIAGNOSIS — R1010 Upper abdominal pain, unspecified: Secondary | ICD-10-CM | POA: Insufficient documentation

## 2019-01-03 DIAGNOSIS — Z91040 Latex allergy status: Secondary | ICD-10-CM | POA: Insufficient documentation

## 2019-01-03 DIAGNOSIS — Z79899 Other long term (current) drug therapy: Secondary | ICD-10-CM | POA: Diagnosis not present

## 2019-01-03 DIAGNOSIS — G8929 Other chronic pain: Secondary | ICD-10-CM | POA: Diagnosis not present

## 2019-01-03 DIAGNOSIS — I10 Essential (primary) hypertension: Secondary | ICD-10-CM | POA: Insufficient documentation

## 2019-01-03 LAB — CBC
HCT: 41.7 % (ref 36.0–46.0)
Hemoglobin: 13 g/dL (ref 12.0–15.0)
MCH: 24.1 pg — ABNORMAL LOW (ref 26.0–34.0)
MCHC: 31.2 g/dL (ref 30.0–36.0)
MCV: 77.4 fL — ABNORMAL LOW (ref 80.0–100.0)
Platelets: 145 10*3/uL — ABNORMAL LOW (ref 150–400)
RBC: 5.39 MIL/uL — ABNORMAL HIGH (ref 3.87–5.11)
RDW: 15.1 % (ref 11.5–15.5)
WBC: 7.3 10*3/uL (ref 4.0–10.5)
nRBC: 0 % (ref 0.0–0.2)

## 2019-01-03 LAB — BASIC METABOLIC PANEL
Anion gap: 11 (ref 5–15)
BUN: 12 mg/dL (ref 8–23)
CO2: 21 mmol/L — ABNORMAL LOW (ref 22–32)
Calcium: 9.4 mg/dL (ref 8.9–10.3)
Chloride: 102 mmol/L (ref 98–111)
Creatinine, Ser: 0.83 mg/dL (ref 0.44–1.00)
GFR calc Af Amer: >60 mL/min (ref >60)
GFR calc non Af Amer: >60 mL/min (ref >60)
Glucose, Bld: 93 mg/dL (ref 70–99)
Potassium: 3.9 mmol/L (ref 3.5–5.1)
Sodium: 134 mmol/L — ABNORMAL LOW (ref 135–145)

## 2019-01-03 LAB — TROPONIN I (HIGH SENSITIVITY): Troponin I (High Sensitivity): 3 ng/L (ref <18)

## 2019-01-03 MED ORDER — SODIUM CHLORIDE 0.9% FLUSH: 3.0000 mL | Freq: Once | INTRAVENOUS | Status: DC

## 2019-01-03 MED ORDER — AMLODIPINE BESYLATE 5 MG PO TABS
5.0000 mg | ORAL_TABLET | Freq: Once | ORAL | Status: AC
  Administered 2019-01-03: 5 mg via ORAL
  Filled 2019-01-03: qty 1

## 2019-01-03 NOTE — ED Notes (Signed)
Patient verbalizes understanding of discharge instructions. Opportunity for questioning and answers were provided. Armband removed by staff, pt discharged from ED.  

## 2019-01-03 NOTE — Discharge Instructions (Addendum)
Please keep a log of your blood pressure and times you take your medications Please follow-up with your primary care doctor next week

## 2019-01-03 NOTE — ED Triage Notes (Signed)
Pt here for eval of chest pain that started today, with dizziness and hypertension. Pt also endorses nausea, no pain currently.

## 2019-01-03 NOTE — ED Provider Notes (Signed)
[facility] EMERGENCY DEPARTMENT Provider Note   CSN: [id number] Arrival date & time: 01/03/19  1438     History   Chief Complaint Chief Complaint  Patient presents with  . Chest Pain    HPI Renee George is a 68 y.o. female.     HPI  68 year old female history of hypertension and upper abdominal pain.  She states this morning she was having her usual upper abdominal discomfort with some associated nausea.  When she took her blood pressure she noted it was high.  She took her home medications and her blood pressure continued to be high.  One systolic blood pressure was 180 and the second 1 was 200.  She talked with the family member and came to the hospital for further evaluation.  She states that the stomach pain is better.  She has had problems with partial small bowel obstructions.  She is denies any vomiting or difficulty stooling.  She denies chest pain, dyspnea, cough, fever, hematemesis, or blood per rectum.  Currently she states that she feels hungry and thinks that she is ready to go home.  Past Medical History:  Diagnosis Date  . High cholesterol   . Hypertension     Patient Active Problem List   Diagnosis Date Noted  . SOB (shortness of breath) 10/22/2012  . ILD (interstitial lung disease) - virus v autoimmune, on followup 10/22/2012  . ANA positive 10/22/2012  . Chronic cough 10/22/2012    No past surgical history on file.   OB History   No obstetric history on file.      Home Medications    Prior to Admission medications   Medication Sig Start Date End Date Taking? Authorizing Provider  amLODipine (NORVASC) 10 MG tablet Take 10 mg by mouth daily.    [provider]  fluticasone (FLONASE) 50 MCG/ACT nasal spray Place 2 sprays into the nose daily. 12/05/12   Brand Males, MD  ibuprofen (ADVIL,MOTRIN) 200 MG tablet Take 200 mg by mouth every 8 (eight) hours as needed for pain. For pain    [provider]   lovastatin (MEVACOR) 20 MG tablet Take 20 mg by mouth at bedtime.    [provider]  Omeprazole-Sodium Bicarbonate (ZEGERID OTC PO) Take 20 mg by mouth daily.    [provider]    Family History No family history on file.  Social History Social History   Tobacco Use  . Smoking status: Never Smoker  Substance Use Topics  . Alcohol use: No  . Drug use: No     Allergies   Lovastatin, Tramadol, and Latex   Review of Systems Review of Systems  All other systems reviewed and are negative.    Physical Exam Updated Vital Signs BP (!) 152/73 (BP Location: Right Arm)   Pulse 73   Temp 98.5 F (36.9 C)   Resp 16   SpO2 100%   Physical Exam Vitals signs reviewed.  Constitutional:      General: She is not in acute distress.    Appearance: She is well-developed and normal weight. She is not ill-appearing.  HENT:     Head: Normocephalic.  Neck:     Musculoskeletal: Normal range of motion.  Cardiovascular:     Rate and Rhythm: Normal rate and regular rhythm.     Heart sounds: Normal heart sounds.  Pulmonary:     Breath sounds: Normal breath sounds.  Abdominal:     General: Bowel sounds are normal.  Palpations: Abdomen is soft.     Tenderness: There is no abdominal tenderness.  Musculoskeletal: Normal range of motion.     Right lower leg: She exhibits no tenderness. No edema.     Left lower leg: She exhibits no tenderness. No edema.  Skin:    General: Skin is warm.     Capillary Refill: Capillary refill takes less than 2 seconds.  Neurological:     General: No focal deficit present.     Mental Status: She is alert.      ED Treatments / Results  Labs (all labs ordered are listed, but only abnormal results are displayed) Labs Reviewed  BASIC METABOLIC PANEL - Abnormal; Notable for the following components:      Result Value   Sodium 134 (*)    CO2 21 (*)    All other components within normal limits  CBC - Abnormal; Notable for the  following components:   RBC 5.39 (*)    MCV 77.4 (*)    MCH 24.1 (*)    Platelets 145 (*)    All other components within normal limits  TROPONIN I (HIGH SENSITIVITY)  TROPONIN I (HIGH SENSITIVITY)    EKG EKG Interpretation  Date/Time:  Friday January 03 2019 14:59:24 EDT Ventricular Rate:  74 PR Interval:  144 QRS Duration: 74 QT Interval:  390 QTC Calculation: 432 R Axis:   67 Text Interpretation:  Normal sinus rhythm Normal ECG Confirmed by Pattricia Boss [phone]) on 01/03/2019 8:34:48 PM   Radiology No results found.  Procedures Procedures (including critical care time)  Medications Ordered in ED Medications  sodium chloride flush (NS) 0.9 % injection 3 mL (has no administration in time range)     Initial Impression / Assessment and Plan / ED Course  I have reviewed the triage vital signs and the nursing notes.  Pertinent labs & imaging results that were available during my care of the patient were reviewed by me and considered in my medical decision making (see chart for details).       Patient reports having epigastric discomfort consistent with her ongoing stomach pain.  She had high blood pressure at home and came in secondary to this.  She took her home medications prior to coming in.  Blood pressures here have ranged systolically from 1 08-6 52 and diastolically have remained in the mid 70s.  She appears hemodynamically stable here.  She has primary care to follow-up with.  She is taking her amlodipine at home.  EKG obtained is normal.  Labs are normal.  She appears stable for discharge.  We have discussed return precautions need for follow-up and she voices understanding Final Clinical Impressions(s) / ED Diagnoses   Final diagnoses:  Hypertension, unspecified type  Chronic abdominal pain    ED Discharge Orders    None       Pattricia Boss, MD 01/03/19 2048
